# Patient Record
Sex: Male | Born: 1995 | Race: Black or African American | Hispanic: No | Marital: Single | Smoking: Current some day smoker
Health system: Southern US, Community
[De-identification: ages and names within clinical notes are randomized; demographics above are authoritative.]

---
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
## 2021-12-23 ENCOUNTER — Emergency Department (HOSPITAL_COMMUNITY)
Admission: EM | Admit: 2021-12-23 | Discharge: 2021-12-24 | Disposition: A | Discharge status: Home / Self Care | Payer: Self-pay | Attending: Emergency Medicine | Admitting: Emergency Medicine

## 2021-12-23 DIAGNOSIS — Z760 Encounter for issue of repeat prescription: Secondary | ICD-10-CM | POA: Insufficient documentation

## 2021-12-23 MED ORDER — DIVALPROEX SODIUM 500 MG PO DR TAB: 1000.0000 mg | DELAYED_RELEASE_TABLET | Freq: Every evening | ORAL | 0 refills | Status: DC

## 2021-12-23 NOTE — Discharge Instructions (Signed)
I provided you with a 2-week refill of your Depakote.  It is important for you to establish care with a behavioral health specialist for additional refills of this medication. Return to the ER if you start to experience worsening symptoms, thoughts of wanting to harm yourself or anyone else, hearing voices, chest pain or shortness of breath.

## 2021-12-23 NOTE — ED Provider Notes (Signed)
[facility] EMERGENCY DEPARTMENT Provider Note   CSN: [id number] Arrival date & time: 12/23/21  1706     History  No chief complaint on file.   Terry Lambert is a 26 y.o. male with a chief complaint of medication refill.  Patient is here with mother.  Patient has been out of his Depakote for about 2 weeks.  He is also due for his Hinda Glatter shot which she gets monthly.  He is yet to establish care with an outpatient behavioral health specialist for refills and for follow-up.  Patient is asymptomatic at this time.  Denies any other complaints.  HPI     Home Medications Prior to Admission medications   Medication Sig Start Date End Date Taking? Authorizing Provider  divalproex (DEPAKOTE) 500 MG DR tablet Take 2 tablets (1,000 mg total) by mouth at bedtime for 15 days. 12/23/21 01/07/22 Yes Timberlee Roblero, PA-C  hydrocortisone (ANUSOL-HC) 2.5 % rectal cream Apply rectally 2 times daily 10/14/13   Janne Napoleon, NP  sulfamethoxazole-trimethoprim (SEPTRA DS) 800-160 MG per tablet Take 1 tablet by mouth every 12 (twelve) hours. 09/06/12   Gilda Crease, MD      Allergies    Patient has no known allergies.    Review of Systems   Review of Systems  Constitutional:  Negative for chills and fever.  Cardiovascular:  Negative for chest pain.  Psychiatric/Behavioral:  Negative for suicidal ideas. The patient is not nervous/anxious.    Physical Exam Updated Vital Signs BP 127/80 (BP Location: Left Arm)   Pulse 93   Temp 98.8 F (37.1 C) (Oral)   Resp 16   SpO2 100%  Physical Exam Vitals and nursing note reviewed.  Constitutional:      General: He is not in acute distress.    Appearance: He is well-developed. He is not diaphoretic.  HENT:     Head: Normocephalic and atraumatic.  Eyes:     General: No scleral icterus.    Conjunctiva/sclera: Conjunctivae normal.  Pulmonary:     Effort: Pulmonary effort is normal. No respiratory distress.  Musculoskeletal:      Cervical back: Normal range of motion.  Skin:    Findings: No rash.  Neurological:     Mental Status: He is alert.    ED Results / Procedures / Treatments   Labs (all labs ordered are listed, but only abnormal results are displayed) Labs Reviewed - No data to display  EKG None  Radiology No results found.  Procedures Procedures    Medications Ordered in ED Medications - No data to display  ED Course/ Medical Decision Making/ A&P                           Medical Decision Making  26 year old male presenting to the ED requesting refills on medications.  He has been out of his Depakote for about 2 weeks.  Also is due for his monthly Invega shot.  I provided him with a refill of his Depakote today.  He is asymptomatic at this time.  Informed him that we do not give Invega injections in this ER.  I provided outpatient follow-up to establish care and for further medication refills.  Patient is hemodynamically stable at this time.  Return precautions given.    Patient is hemodynamically stable, in NAD, and able to ambulate in the ED. Evaluation does not show pathology that would require ongoing emergent intervention or inpatient treatment. I explained  the diagnosis to the patient. Pain has been managed and has no complaints prior to discharge. Patient is comfortable with above plan and is stable for discharge at this time. All questions were answered prior to disposition. Strict return precautions for returning to the ED were discussed. Encouraged follow up with PCP.   An After Visit Summary was printed and given to the patient.   Portions of this note were generated with Scientist, clinical (histocompatibility and immunogenetics). Dictation errors may occur despite best attempts at proofreading.         Final Clinical Impression(s) / ED Diagnoses Final diagnoses:  Encounter for medication refill    Rx / DC Orders ED Discharge Orders          Ordered    divalproex (DEPAKOTE) 500 MG DR tablet  Nightly         12/23/21 1731              Dietrich Pates, PA-C 12/23/21 1734    Bethann Berkshire, MD 12/27/21 0930

## 2022-01-27 ENCOUNTER — Ambulatory Visit (INDEPENDENT_AMBULATORY_CARE_PROVIDER_SITE_OTHER): Payer: No Payment, Other | Admitting: *Deleted

## 2022-01-27 ENCOUNTER — Encounter (HOSPITAL_COMMUNITY): Payer: Self-pay

## 2022-01-27 ENCOUNTER — Telehealth (HOSPITAL_COMMUNITY): Payer: Self-pay | Admitting: *Deleted

## 2022-01-27 ENCOUNTER — Ambulatory Visit (INDEPENDENT_AMBULATORY_CARE_PROVIDER_SITE_OTHER): Payer: No Payment, Other | Admitting: Student in an Organized Health Care Education/Training Program

## 2022-01-27 DIAGNOSIS — F23 Brief psychotic disorder: Secondary | ICD-10-CM | POA: Diagnosis not present

## 2022-01-27 MED ORDER — INVEGA SUSTENNA 156 MG/ML IM SUSY: 156.0000 mg | PREFILLED_SYRINGE | Freq: Once | INTRAMUSCULAR | 0 refills | Status: DC

## 2022-01-27 MED ORDER — DIVALPROEX SODIUM ER 500 MG PO TB24: 1000.0000 mg | ORAL_TABLET | Freq: Every day | ORAL | 1 refills | Status: DC

## 2022-01-27 MED ORDER — BENZTROPINE MESYLATE 1 MG PO TABS: 1.0000 mg | ORAL_TABLET | Freq: Two times a day (BID) | ORAL | 1 refills | Status: DC

## 2022-01-27 MED ORDER — PALIPERIDONE PALMITATE ER 156 MG/ML IM SUSY
156.0000 mg | PREFILLED_SYRINGE | Freq: Once | INTRAMUSCULAR | Status: AC
  Administered 2022-01-27: 156 mg via INTRAMUSCULAR

## 2022-01-27 NOTE — Progress Notes (Deleted)
save

## 2022-01-27 NOTE — Progress Notes (Signed)
New patient today to clinic, seen as a walk in by Dr Morrie Sheldon. She has ordered him to take a monthly injection of Invega S 156 mg.He got his shot in his R DELTOID without difficulty. He is quiet, limited insight shown. Pleasant, offers no conversation, only short answers to questions asked. I spoke to his Uncle who brought him to the clinic today to provide him the return appt. He had questions about his lab work, about that time Dr Morrie Sheldon came to the lobby and took over the conversation about lab to be done in July. [facility] is to return in 28 days for his next injection. He is to bring his own injection as he has MCD.

## 2022-01-27 NOTE — Patient Instructions (Addendum)
Please get lab work done on 02/10/2022 at  : [facility]  [address] [place], [place] [address] (Located in the [place] building across from [place])    Please pick up your next injection: 02/21/2022 at your pharmacy  All other medications should be available today.    Please come to your followup appt.    Thank you

## 2022-01-28 ENCOUNTER — Other Ambulatory Visit (HOSPITAL_COMMUNITY): Payer: Self-pay | Admitting: Student in an Organized Health Care Education/Training Program

## 2022-01-28 ENCOUNTER — Other Ambulatory Visit: Payer: Self-pay

## 2022-01-28 DIAGNOSIS — F23 Brief psychotic disorder: Secondary | ICD-10-CM

## 2022-01-28 MED ORDER — DIVALPROEX SODIUM ER 500 MG PO TB24
1000.0000 mg | ORAL_TABLET | Freq: Every day | ORAL | 1 refills | Status: AC
  Filled 2022-01-28 – 2022-02-10 (×2): qty 60, 30d supply, fill #0

## 2022-01-28 MED ORDER — INVEGA SUSTENNA 156 MG/ML IM SUSY
156.0000 mg | PREFILLED_SYRINGE | Freq: Once | INTRAMUSCULAR | 0 refills | Status: AC
  Filled 2022-01-28 – 2022-02-22 (×2): qty 1, 1d supply, fill #0

## 2022-01-28 MED ORDER — BENZTROPINE MESYLATE 1 MG PO TABS
1.0000 mg | ORAL_TABLET | Freq: Two times a day (BID) | ORAL | 1 refills | Status: AC
  Filled 2022-01-28 – 2022-02-10 (×2): qty 60, 30d supply, fill #0

## 2022-01-31 ENCOUNTER — Other Ambulatory Visit: Payer: Self-pay

## 2022-02-04 ENCOUNTER — Other Ambulatory Visit: Payer: Self-pay

## 2022-02-09 ENCOUNTER — Other Ambulatory Visit: Payer: Self-pay

## 2022-02-09 ENCOUNTER — Other Ambulatory Visit (HOSPITAL_COMMUNITY): Payer: Self-pay

## 2022-02-10 ENCOUNTER — Other Ambulatory Visit: Payer: Self-pay

## 2022-02-22 ENCOUNTER — Other Ambulatory Visit: Payer: Self-pay

## 2022-02-22 ENCOUNTER — Ambulatory Visit (INDEPENDENT_AMBULATORY_CARE_PROVIDER_SITE_OTHER): Payer: No Payment, Other | Admitting: *Deleted

## 2022-02-22 ENCOUNTER — Ambulatory Visit (HOSPITAL_COMMUNITY): Payer: No Payment, Other

## 2022-02-22 ENCOUNTER — Encounter (HOSPITAL_COMMUNITY): Payer: Self-pay

## 2022-02-22 VITALS — BP 107/88 | HR 63 | Ht 71.0 in | Wt 171.0 lb

## 2022-02-22 DIAGNOSIS — F23 Brief psychotic disorder: Secondary | ICD-10-CM | POA: Diagnosis not present

## 2022-02-22 MED ORDER — PALIPERIDONE PALMITATE ER 156 MG/ML IM SUSY
156.0000 mg | PREFILLED_SYRINGE | Freq: Once | INTRAMUSCULAR | Status: AC
  Administered 2022-02-22: 156 mg via INTRAMUSCULAR

## 2022-02-22 NOTE — Progress Notes (Signed)
Patient arrived for injection with MOM paliperidone (INVEGA SUSTENNA) 156 MG Patient informed that he will be having his second appointment with [facility]in a few days. Given his last injection today & informed that this is # 2  & there will be no more after this. Asked which facility would he would like to use this is his choice & he said [facility] it's closer to his home. Patient resides in [facility] [place]

## 2022-02-28 ENCOUNTER — Encounter (HOSPITAL_COMMUNITY): Payer: No Payment, Other | Admitting: Student in an Organized Health Care Education/Training Program
